# Patient Record
Sex: Female | Born: 1955 | Race: Black or African American | Hispanic: No | Smoking: Current every day smoker
Health system: Southern US, Community
[De-identification: ages and names within clinical notes are randomized; demographics above are authoritative.]

---

## 1978-07-19 HISTORY — PX: HERNIA REPAIR: SHX51

## 1999-02-02 ENCOUNTER — Ambulatory Visit (HOSPITAL_COMMUNITY): Admission: RE | Admit: 1999-02-02 | Discharge: 1999-02-02 | Payer: Self-pay | Admitting: *Deleted

## 2004-05-29 ENCOUNTER — Ambulatory Visit: Payer: Self-pay | Admitting: Family Medicine

## 2004-08-13 ENCOUNTER — Encounter: Admission: RE | Admit: 2004-08-13 | Discharge: 2004-08-13 | Payer: Self-pay | Admitting: Family Medicine

## 2004-08-18 ENCOUNTER — Encounter: Admission: RE | Admit: 2004-08-18 | Discharge: 2004-08-18 | Payer: Self-pay | Admitting: Family Medicine

## 2004-09-07 ENCOUNTER — Ambulatory Visit: Payer: Self-pay | Admitting: Gastroenterology

## 2004-09-17 ENCOUNTER — Ambulatory Visit: Payer: Self-pay | Admitting: Gastroenterology

## 2011-01-21 ENCOUNTER — Emergency Department (HOSPITAL_COMMUNITY)
Admission: EM | Admit: 2011-01-21 | Discharge: 2011-01-22 | Disposition: A | Discharge status: Home / Self Care | Payer: Medicaid Other | Attending: Emergency Medicine | Admitting: Emergency Medicine

## 2011-01-21 DIAGNOSIS — M545 Low back pain, unspecified: Secondary | ICD-10-CM | POA: Insufficient documentation

## 2011-01-21 DIAGNOSIS — X58XXXA Exposure to other specified factors, initial encounter: Secondary | ICD-10-CM | POA: Insufficient documentation

## 2011-01-21 DIAGNOSIS — S335XXA Sprain of ligaments of lumbar spine, initial encounter: Secondary | ICD-10-CM | POA: Insufficient documentation

## 2011-01-28 ENCOUNTER — Other Ambulatory Visit: Payer: Self-pay | Admitting: Family Medicine

## 2011-01-28 DIAGNOSIS — Z1231 Encounter for screening mammogram for malignant neoplasm of breast: Secondary | ICD-10-CM

## 2011-02-05 ENCOUNTER — Ambulatory Visit
Admission: RE | Admit: 2011-02-05 | Discharge: 2011-02-05 | Disposition: A | Discharge status: Home / Self Care | Payer: Medicaid Other | Source: Ambulatory Visit | Attending: Family Medicine | Admitting: Family Medicine

## 2011-02-05 DIAGNOSIS — Z1231 Encounter for screening mammogram for malignant neoplasm of breast: Secondary | ICD-10-CM

## 2011-03-15 ENCOUNTER — Other Ambulatory Visit (HOSPITAL_COMMUNITY): Payer: Self-pay | Admitting: Obstetrics

## 2011-03-15 DIAGNOSIS — N951 Menopausal and female climacteric states: Secondary | ICD-10-CM

## 2011-03-24 ENCOUNTER — Ambulatory Visit (HOSPITAL_COMMUNITY): Payer: Medicaid Other

## 2011-03-31 ENCOUNTER — Ambulatory Visit (HOSPITAL_COMMUNITY): Admission: RE | Admit: 2011-03-31 | Payer: Medicaid Other | Source: Ambulatory Visit

## 2011-04-07 ENCOUNTER — Ambulatory Visit (HOSPITAL_COMMUNITY)
Admission: RE | Admit: 2011-04-07 | Discharge: 2011-04-07 | Disposition: A | Discharge status: Home / Self Care | Payer: Medicaid Other | Source: Ambulatory Visit | Attending: Obstetrics | Admitting: Obstetrics

## 2011-04-07 DIAGNOSIS — N951 Menopausal and female climacteric states: Secondary | ICD-10-CM

## 2011-04-07 DIAGNOSIS — Z780 Asymptomatic menopausal state: Secondary | ICD-10-CM | POA: Insufficient documentation

## 2011-04-07 DIAGNOSIS — Z13820 Encounter for screening for osteoporosis: Secondary | ICD-10-CM | POA: Insufficient documentation

## 2011-08-25 ENCOUNTER — Encounter: Payer: Self-pay | Admitting: Gastroenterology

## 2014-03-21 ENCOUNTER — Ambulatory Visit: Payer: Medicaid Other | Admitting: Family Medicine

## 2014-03-22 ENCOUNTER — Telehealth: Payer: Self-pay | Admitting: Family Medicine

## 2014-03-22 NOTE — Telephone Encounter (Signed)
Attempted to call patient to reschedule however phone line was unavailable;

## 2014-04-25 ENCOUNTER — Other Ambulatory Visit (HOSPITAL_COMMUNITY): Admission: RE | Admit: 2014-04-25 | Payer: Medicaid Other | Source: Ambulatory Visit | Admitting: Family Medicine

## 2014-04-25 ENCOUNTER — Encounter: Payer: Self-pay | Admitting: Internal Medicine

## 2014-04-25 ENCOUNTER — Ambulatory Visit: Payer: No Typology Code available for payment source | Attending: Family Medicine | Admitting: Internal Medicine

## 2014-04-25 ENCOUNTER — Other Ambulatory Visit (HOSPITAL_COMMUNITY)
Admission: RE | Admit: 2014-04-25 | Discharge: 2014-04-25 | Disposition: A | Discharge status: Home / Self Care | Payer: No Typology Code available for payment source | Source: Ambulatory Visit | Attending: Family Medicine | Admitting: Family Medicine

## 2014-04-25 VITALS — BP 153/83 | HR 73 | Temp 98.8°F | Resp 16 | Ht 67.5 in | Wt 131.0 lb

## 2014-04-25 DIAGNOSIS — F17200 Nicotine dependence, unspecified, uncomplicated: Secondary | ICD-10-CM

## 2014-04-25 DIAGNOSIS — M549 Dorsalgia, unspecified: Secondary | ICD-10-CM | POA: Insufficient documentation

## 2014-04-25 DIAGNOSIS — Z01419 Encounter for gynecological examination (general) (routine) without abnormal findings: Secondary | ICD-10-CM | POA: Insufficient documentation

## 2014-04-25 DIAGNOSIS — R030 Elevated blood-pressure reading, without diagnosis of hypertension: Secondary | ICD-10-CM

## 2014-04-25 DIAGNOSIS — N760 Acute vaginitis: Secondary | ICD-10-CM | POA: Insufficient documentation

## 2014-04-25 DIAGNOSIS — A539 Syphilis, unspecified: Secondary | ICD-10-CM

## 2014-04-25 DIAGNOSIS — Z2821 Immunization not carried out because of patient refusal: Secondary | ICD-10-CM

## 2014-04-25 DIAGNOSIS — Z113 Encounter for screening for infections with a predominantly sexual mode of transmission: Secondary | ICD-10-CM | POA: Insufficient documentation

## 2014-04-25 DIAGNOSIS — Z0000 Encounter for general adult medical examination without abnormal findings: Secondary | ICD-10-CM

## 2014-04-25 DIAGNOSIS — Z780 Asymptomatic menopausal state: Secondary | ICD-10-CM | POA: Insufficient documentation

## 2014-04-25 DIAGNOSIS — F17210 Nicotine dependence, cigarettes, uncomplicated: Secondary | ICD-10-CM | POA: Insufficient documentation

## 2014-04-25 DIAGNOSIS — I10 Essential (primary) hypertension: Secondary | ICD-10-CM | POA: Insufficient documentation

## 2014-04-25 DIAGNOSIS — IMO0001 Reserved for inherently not codable concepts without codable children: Secondary | ICD-10-CM | POA: Insufficient documentation

## 2014-04-25 DIAGNOSIS — Z720 Tobacco use: Secondary | ICD-10-CM

## 2014-04-25 LAB — COMPLETE METABOLIC PANEL WITH GFR
ALK PHOS: 40 U/L (ref 39–117)
ALT: 14 U/L (ref 0–35)
AST: 18 U/L (ref 0–37)
Albumin: 4.6 g/dL (ref 3.5–5.2)
BUN: 7 mg/dL (ref 6–23)
CO2: 21 mEq/L (ref 19–32)
Calcium: 9.7 mg/dL (ref 8.4–10.5)
Chloride: 103 mEq/L (ref 96–112)
Creat: 0.72 mg/dL (ref 0.50–1.10)
GFR, Est African American: >89 mL/min
GFR, Est Non African American: >89 mL/min
Glucose, Bld: 94 mg/dL (ref 70–99)
POTASSIUM: 4.1 meq/L (ref 3.5–5.3)
SODIUM: 137 meq/L (ref 135–145)
TOTAL PROTEIN: 7.9 g/dL (ref 6.0–8.3)
Total Bilirubin: 0.4 mg/dL (ref 0.2–1.2)

## 2014-04-25 LAB — POCT URINALYSIS DIPSTICK
BILIRUBIN UA: NEGATIVE
Glucose, UA: NEGATIVE
KETONES UA: NEGATIVE
Leukocytes, UA: NEGATIVE
Nitrite, UA: NEGATIVE
PH UA: 5
RBC UA: NEGATIVE
Spec Grav, UA: 1.025
Urobilinogen, UA: 0.2

## 2014-04-25 LAB — RPR TITER: RPR Titer: 1:2 {titer}

## 2014-04-25 LAB — CBC
HEMATOCRIT: 47.2 % — AB (ref 36.0–46.0)
Hemoglobin: 15.7 g/dL — ABNORMAL HIGH (ref 12.0–15.0)
MCH: 31 pg (ref 26.0–34.0)
MCHC: 33.3 g/dL (ref 30.0–36.0)
MCV: 93.3 fL (ref 78.0–100.0)
Platelets: 289 10*3/uL (ref 150–400)
RBC: 5.06 MIL/uL (ref 3.87–5.11)
RDW: 12.9 % (ref 11.5–15.5)
WBC: 5.9 10*3/uL (ref 4.0–10.5)

## 2014-04-25 LAB — RPR: RPR Ser Ql: REACTIVE — AB

## 2014-04-25 LAB — LIPID PANEL
Cholesterol: 230 mg/dL — ABNORMAL HIGH (ref 0–200)
HDL: 85 mg/dL (ref >39)
LDL Cholesterol: 131 mg/dL — ABNORMAL HIGH (ref 0–99)
Total CHOL/HDL Ratio: 2.7 Ratio
Triglycerides: 72 mg/dL (ref <150)
VLDL: 14 mg/dL (ref 0–40)
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
## 2014-04-26 LAB — CYTOLOGY - PAP

## 2014-04-26 LAB — HIV ANTIBODY (ROUTINE TESTING W REFLEX): HIV: NONREACTIVE

## 2014-04-26 LAB — VITAMIN D 25 HYDROXY (VIT D DEFICIENCY, FRACTURES): VIT D 25 HYDROXY: 16 ng/mL — AB (ref 30–89)

## 2014-04-26 LAB — CERVICOVAGINAL ANCILLARY ONLY
Chlamydia: NEGATIVE
NEISSERIA GONORRHEA: NEGATIVE

## 2014-04-26 LAB — FLUORESCENT TREPONEMAL AB(FTA)-IGG-BLD: Fluorescent Treponemal ABS: REACTIVE — AB

## 2014-04-29 ENCOUNTER — Telehealth: Payer: Self-pay

## 2014-04-29 LAB — CERVICOVAGINAL ANCILLARY ONLY
Wet Prep (BD Affirm): NEGATIVE
Wet Prep (BD Affirm): NEGATIVE
Wet Prep (BD Affirm): NEGATIVE

## 2014-04-29 MED ORDER — VITAMIN D (ERGOCALCIFEROL) 1.25 MG (50000 UNIT) PO CAPS: 50000.0000 [IU] | ORAL_CAPSULE | ORAL | Status: AC

## 2014-04-29 MED ORDER — ATORVASTATIN CALCIUM 10 MG PO TABS: 10.0000 mg | ORAL_TABLET | Freq: Every day | ORAL | Status: AC

## 2014-04-29 NOTE — Telephone Encounter (Signed)
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  Let her know that this is a STD and all partners will need to be tested/treated.  This is a very serious disease and treatment is needed soon. Please send referral to Infectious Disease for treatment.  Cholesterol is elevated please send atorvastatin 10 mg and vitamin d 50,000 IU once weekly for 12 weeks with no refills. Please educate on dietary changes and exercise.  Thanks ------

## 2014-04-29 NOTE — Addendum Note (Signed)
Addended by: Lestine MountJUAREZ, Draven Laine L on: 04/29/2014 11:57 AM   Modules accepted: Orders

## 2014-04-29 NOTE — Telephone Encounter (Signed)
Patient not available Left message on voice mail to return our call 

## 2014-04-30 ENCOUNTER — Telehealth: Payer: Self-pay | Admitting: Emergency Medicine

## 2014-04-30 NOTE — Telephone Encounter (Signed)
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  Let her know that this is a STD and all partners will need to be tested/treated.  This is a very serious disease and treatment is needed soon. Please send referral to Infectious Disease for treatment.  Cholesterol is elevated please send atorvastatin 10 mg and vitamin d 50,000 IU once weekly for 12 weeks with no refills. Please educate on dietary changes and exercise.  Thanks ------

## 2014-04-30 NOTE — Telephone Encounter (Signed)
Pt given lab results with instructions on referral to ID Pt also informed of cholesterol/Vitamin D replacements needs. Medication will be sent to [facility] pharmacy Pt will be in tomorrow to pick up

## 2014-05-02 NOTE — Telephone Encounter (Signed)
Message copied by Darlis LoanSMITH, JILL D on Thu May 02, 2014  5:05 PM ------      Message from: Holland CommonsKECK, VALERIE A      Created: Tue Apr 30, 2014  8:18 PM       Pap is negative for malignancies ------

## 2014-05-02 NOTE — Telephone Encounter (Signed)
Pt given negative pap smear results 

## 2014-05-09 ENCOUNTER — Ambulatory Visit: Payer: No Typology Code available for payment source | Attending: Family Medicine

## 2014-05-09 NOTE — Patient Instructions (Signed)
Pt comes in for blood pressure recheck due to elevated BP during last visit BP 145/83 72 Pt requesting referral number for Infectious Disease- pt need to schedule appt with health department for treatmentDASH Eating Plan DASH stands for "Dietary Approaches to Stop Hypertension." The DASH eating plan is a healthy eating plan that has been shown to reduce high blood pressure (hypertension). Additional health benefits may include reducing the risk of type 2 diabetes mellitus, heart disease, and stroke. The DASH eating plan may also help with weight loss. WHAT DO I NEED TO KNOW ABOUT THE DASH EATING PLAN? For the DASH eating plan, you will follow these general guidelines:  Choose foods with a percent daily value for sodium of less than 5% (as listed on the food label).  Use salt-free seasonings or herbs instead of table salt or sea salt.  Check with your health care provider or pharmacist before using salt substitutes.  Eat lower-sodium products, often labeled as "lower sodium" or "no salt added."  Eat fresh foods.  Eat more vegetables, fruits, and low-fat dairy products.  Choose whole grains. Look for the word "whole" as the first word in the ingredient list.  Choose fish and skinless chicken or Malaw[place]urkey more often than red meat. Limit fish, poultry, and meat to 6 oz (170 g) each day.  Limit sweets, desserts, sugars, and sugary drinks.  Choose heart-healthy fats.  Limit cheese to 1 oz (28 g) per day.  Eat more home-cooked food and less restaurant, buffet, and fast food.  Limit fried foods.  Cook foods using methods other than frying.  Limit canned vegetables. If you do use them, rinse them well to decrease the sodium.  When eating at a restaurant, ask that your food be prepared with less salt, or no salt if possible. WHAT FOODS CAN I EAT? Seek help from a dietitian for individual calorie needs. Grains Whole grain or whole wheat bread. Brown rice. Whole grain or whole wheat pasta.  Quinoa, bulgur, and whole grain cereals. Low-sodium cereals. Corn or whole wheat flour tortillas. Whole grain cornbread. Whole grain crackers. Low-sodium crackers. Vegetables Fresh or frozen vegetables (raw, steamed, roasted, or grilled). Low-sodium or reduced-sodium tomato and vegetable juices. Low-sodium or reduced-sodium tomato sauce and paste. Low-sodium or reduced-sodium canned vegetables.  Fruits All fresh, canned (in natural juice), or frozen fruits. Meat and Other Protein Products Ground beef (85% or leaner), grass-fed beef, or beef trimmed of fat. Skinless chicken or Malaw[place]key. Ground chicken or Mal[place]urkey. Pork trimmed of fat. All fish and seafood. Eggs. Dried beans, peas, or lentils. Unsalted nuts and seeds. Unsalted canned beans. Dairy Low-fat dairy products, such as skim or 1% milk, 2% or reduced-fat cheeses, low-fat ricotta or cottage cheese, or plain low-fat yogurt. Low-sodium or reduced-sodium cheeses. Fats and Oils Tub margarines without trans fats. Light or reduced-fat mayonnaise and salad dressings (reduced sodium). Avocado. Safflower, olive, or canola oils. Natural peanut or almond butter. Other Unsalted popcorn and pretzels. The items listed above may not be a complete list of recommended foods or beverages. Contact your dietitian for more options. WHAT FOODS ARE NOT RECOMMENDED? Grains White bread. White pasta. White rice. Refined cornbread. Bagels and croissants. Crackers that contain trans fat. Vegetables Creamed or fried vegetables. Vegetables in a cheese sauce. Regular canned vegetables. Regular canned tomato sauce and paste. Regular tomato and vegetable juices. Fruits Dried fruits. Canned fruit in light or heavy syrup. Fruit juice. Meat and Other Protein Products Fatty cuts of meat. Ribs, chicken wings, bacon, sausage, bologna,  salami, chitterlings, fatback, hot dogs, bratwurst, and packaged luncheon meats. Salted nuts and seeds. Canned beans with salt. Dairy Whole or 2%  milk, cream, half-and-half, and cream cheese. Whole-fat or sweetened yogurt. Full-fat cheeses or blue cheese. Nondairy creamers and whipped toppings. Processed cheese, cheese spreads, or cheese curds. Condiments Onion and garlic salt, seasoned salt, table salt, and sea salt. Canned and packaged gravies. Worcestershire sauce. Tartar sauce. Barbecue sauce. Teriyaki sauce. Soy sauce, including reduced sodium. Steak sauce. Fish sauce. Oyster sauce. Cocktail sauce. Horseradish. Ketchup and mustard. Meat flavorings and tenderizers. Bouillon cubes. Hot sauce. Tabasco sauce. Marinades. Taco seasonings. Relishes. Fats and Oils Butter, stick margarine, lard, shortening, ghee, and bacon fat. Coconut, palm kernel, or palm oils. Regular salad dressings. Other Pickles and olives. Salted popcorn and pretzels. The items listed above may not be a complete list of foods and beverages to avoid. Contact your dietitian for more information. WHERE CAN I FIND MORE INFORMATION? National Heart, Lung, and Blood Institute: travelstabloid.com Document Released: 06/24/2011 Document Revised: 11/19/2013 Document Reviewed: 05/09/2013 [facility] Patient Information 2015 [place], [place]. This information is not intended to replace advice given to you by your health care provider. Make sure you discuss any questions you have with your health care provider.

## 2014-05-09 NOTE — Progress Notes (Unsigned)
Patient ID: Jessica GumsVivian Mccormick, female   DOB: [birth year], 58 y.o.   MRN: [id number] Pt comes in today for blood pressure recheck Not taking medication for HTN Denies headache,n,v or chest pain BP-145/83 72

## 2017-08-28 ENCOUNTER — Other Ambulatory Visit: Payer: Self-pay

## 2017-08-28 ENCOUNTER — Encounter (HOSPITAL_COMMUNITY): Payer: Self-pay

## 2017-08-28 ENCOUNTER — Emergency Department (HOSPITAL_COMMUNITY): Payer: Self-pay

## 2017-08-28 ENCOUNTER — Emergency Department (HOSPITAL_COMMUNITY)
Admission: EM | Admit: 2017-08-28 | Discharge: 2017-08-28 | Disposition: A | Discharge status: Home / Self Care | Payer: Self-pay | Attending: Emergency Medicine | Admitting: Emergency Medicine

## 2017-08-28 DIAGNOSIS — M545 Low back pain, unspecified: Secondary | ICD-10-CM

## 2017-08-28 DIAGNOSIS — F17210 Nicotine dependence, cigarettes, uncomplicated: Secondary | ICD-10-CM | POA: Insufficient documentation

## 2017-08-28 DIAGNOSIS — R030 Elevated blood-pressure reading, without diagnosis of hypertension: Secondary | ICD-10-CM | POA: Insufficient documentation

## 2017-08-28 DIAGNOSIS — Z79899 Other long term (current) drug therapy: Secondary | ICD-10-CM | POA: Insufficient documentation

## 2017-08-28 LAB — URINALYSIS, ROUTINE W REFLEX MICROSCOPIC
BILIRUBIN URINE: NEGATIVE
GLUCOSE, UA: NEGATIVE mg/dL
HGB URINE DIPSTICK: NEGATIVE
Ketones, ur: NEGATIVE mg/dL
Leukocytes, UA: NEGATIVE
Nitrite: NEGATIVE
Protein, ur: NEGATIVE mg/dL
SPECIFIC GRAVITY, URINE: 1.015 (ref 1.005–1.030)
pH: 5 (ref 5.0–8.0)

## 2017-08-28 MED ORDER — METHOCARBAMOL 500 MG PO TABS
500.0000 mg | ORAL_TABLET | Freq: Once | ORAL | Status: AC
Start: 2017-08-28 — End: 2017-08-28
  Administered 2017-08-28: 500 mg via ORAL
  Filled 2017-08-28: qty 1

## 2017-08-28 MED ORDER — METHOCARBAMOL 500 MG PO TABS: 500.0000 mg | ORAL_TABLET | Freq: Two times a day (BID) | ORAL | 0 refills | Status: AC | PRN

## 2017-08-28 MED ORDER — OXYCODONE-ACETAMINOPHEN 5-325 MG PO TABS
1.0000 | ORAL_TABLET | Freq: Once | ORAL | Status: AC
  Administered 2017-08-28: 1 via ORAL
  Filled 2017-08-28: qty 1

## 2017-08-28 MED ORDER — NAPROXEN 500 MG PO TABS: 500.0000 mg | ORAL_TABLET | Freq: Two times a day (BID) | ORAL | 0 refills | Status: AC | PRN

## 2017-08-28 NOTE — ED Notes (Signed)
Called for patient in waiting area with no answer

## 2017-08-28 NOTE — ED Triage Notes (Addendum)
Per Pt, Pt reports that she is having left lower back pain in the middle the started this morning. Denies any injury. Denies any urinary symptoms, or numbness and tingling.

## 2017-08-28 NOTE — ED Notes (Signed)
Patient transported to X-ray 

## 2017-08-28 NOTE — Discharge Instructions (Signed)
It was my pleasure taking care of you today!   You have been seen in the Emergency Department today for back pain.   Robaxin is your muscle relaxer to take as needed. Naproxen as needed for pain. In addition to this, use ice and/or heat for additional pain relief.  Your back pain should get better over the next 2 weeks. Please follow up with your doctor this week for a recheck if still having symptoms.  Your blood pressure was elevated today. You need to call a primary care doctor to schedule a follow up appointment for blood pressure recheck as soon as possible.   COLD THERAPY DIRECTIONS:  Ice or gel packs can be used to reduce both pain and swelling. Ice is the most helpful within the first 24 to 48 hours after an injury or flareup from overusing a muscle or joint.  Ice is effective, has very few side effects, and is safe for most people to use.    Return to the ED for worsening back pain, fever, weakness or numbness of either leg, or if you develop either (1) an inability to urinate or have bowel movements, or (2) loss of your ability to control your bathroom functions (if you start having "accidents"), or if you develop other new symptoms that concern you.   To find a primary care or specialty doctor please call [phone] or [phone] to access "[facility] Find a Doctor Service."  You may also go on the [facility] website at [email or web address]  There are also multiple [facility] practices throughout the [place] that are frequently accepting new patients. You may find a clinic that is close to your home and contact them.  [facility] - [address] [place] [id number] [phone]  [facility] in [place] (also locations in [place] and [place]) - [address] [place] [phone]  [facility] - [address] [place] [phone]
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
## 2019-08-06 IMAGING — DX DG LUMBAR SPINE COMPLETE 4+V
5 series · 5 of 5 positions shown · non-contrast
Comparison: None.

CLINICAL DATA: Lumbago

EXAM:
LUMBAR SPINE - COMPLETE 4+ VIEW

[t lumbar spine ap]
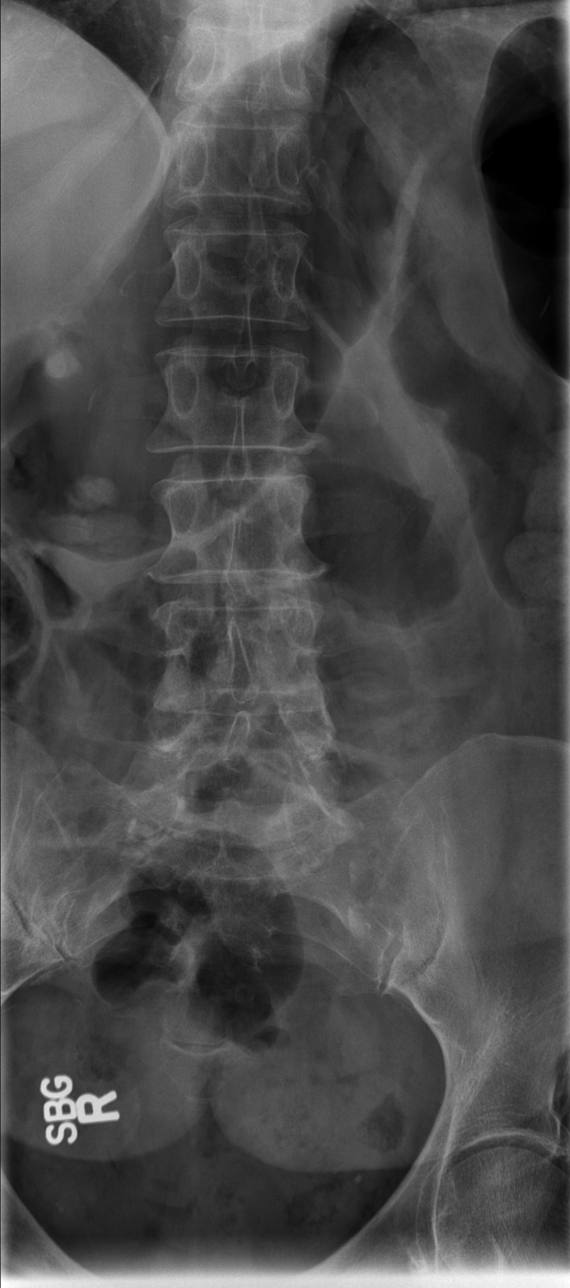

[t lumbar spine obl (1 of 2)]
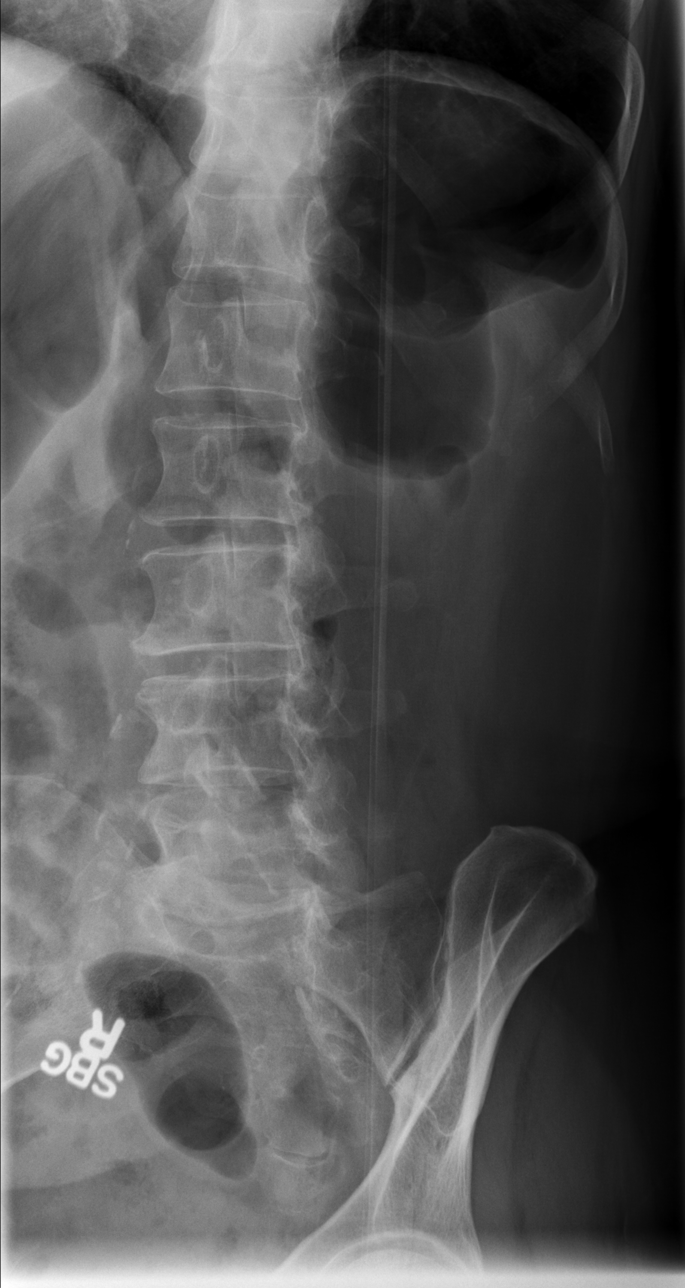

[t lumbar spine obl (2 of 2)]
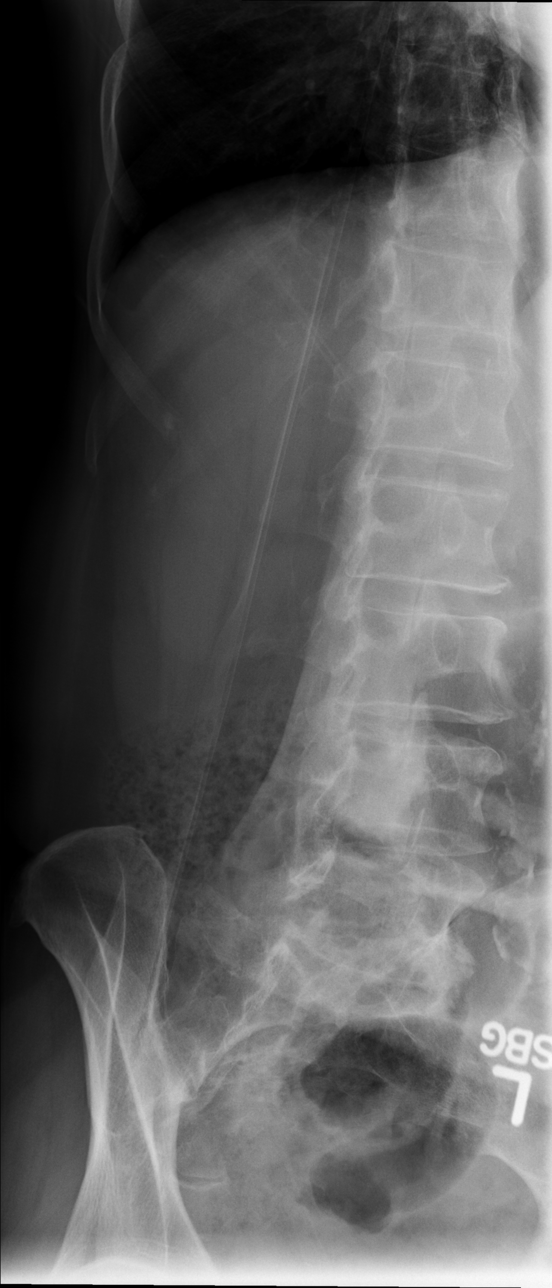

[t lumbar spine lat]
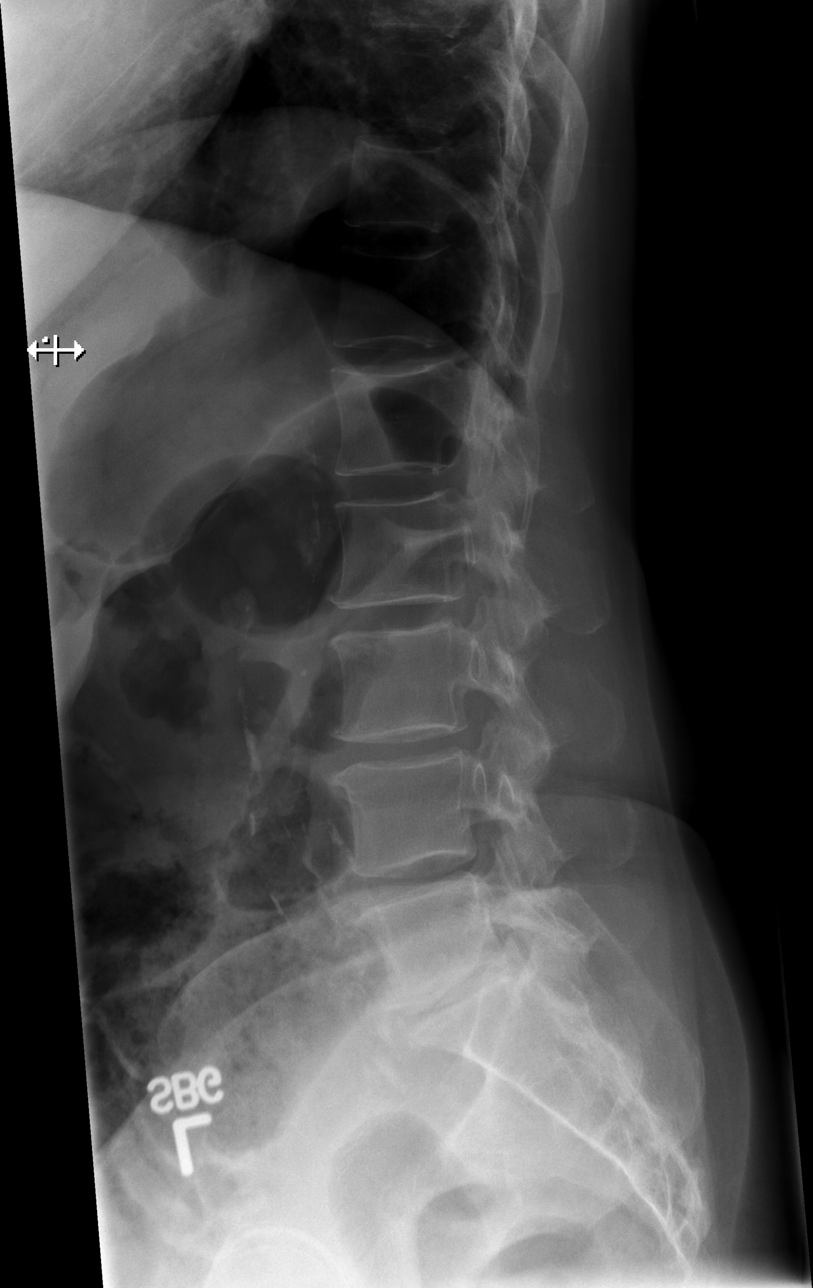

[t lumbar l-5 s-1 spot]
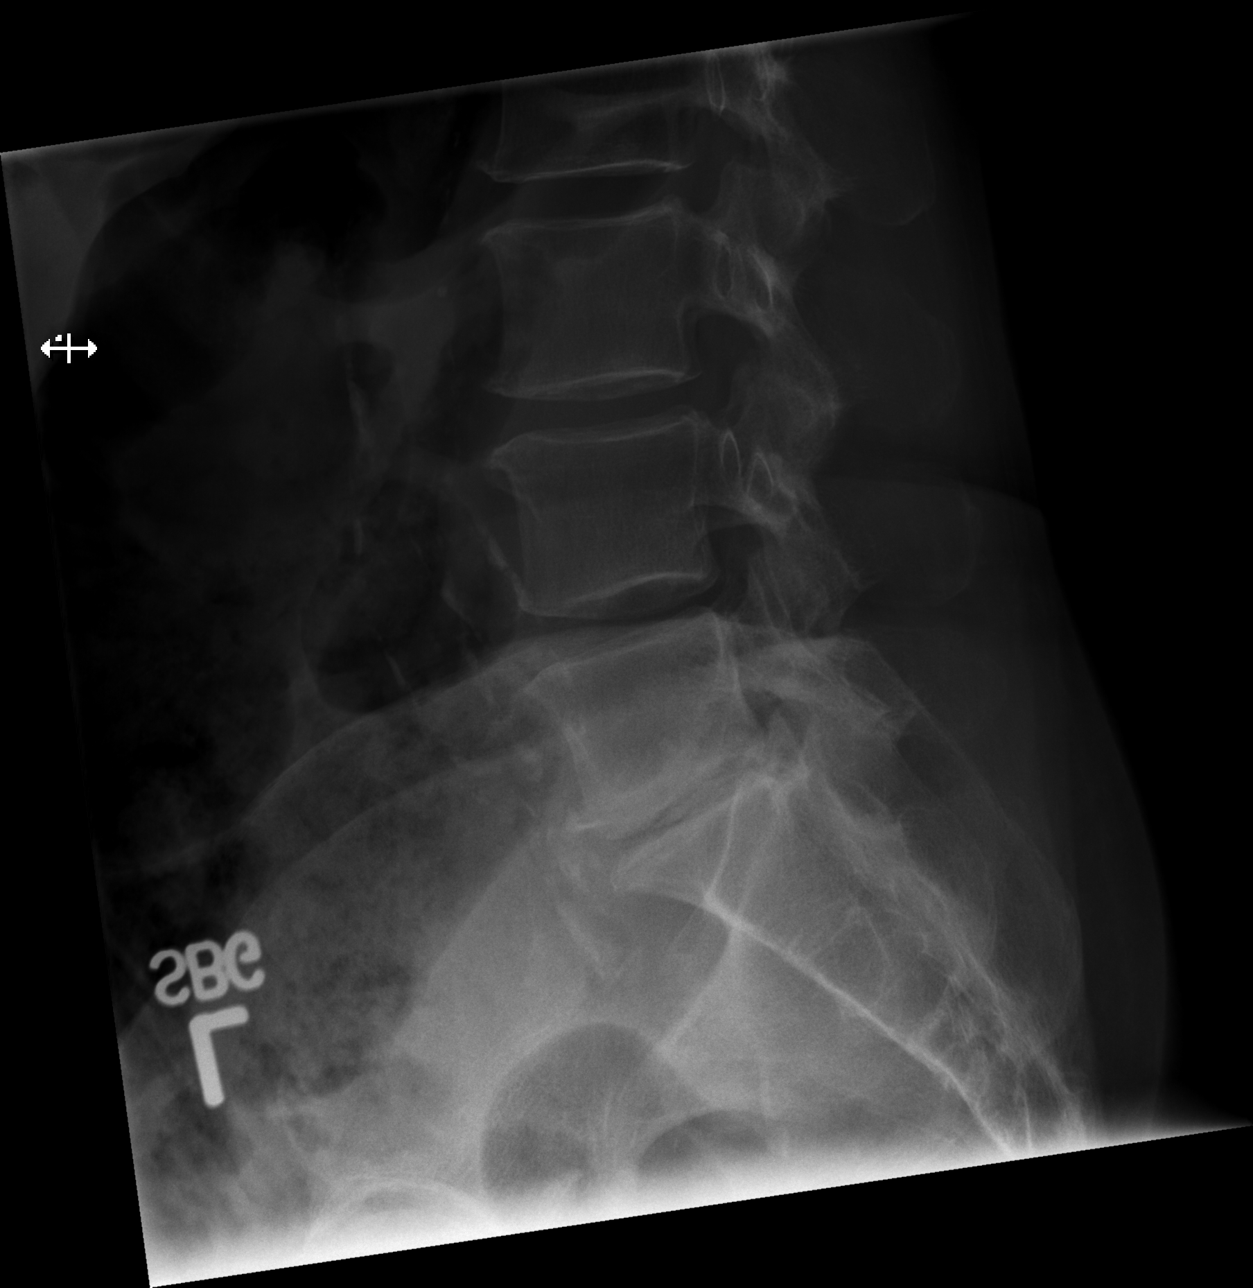

[5 of 5 positions shown; findings below may reference images not displayed]

FINDINGS: Frontal, lateral, spot lumbosacral lateral, and bilateral oblique
views were obtained. There are 5 non-rib-bearing lumbar type
vertebral bodies. There is no fracture or spondylolisthesis. There
is moderately severe disc space narrowing at L5-S1. There is milder
disc space narrowing at L4-5. There are anterior osteophytes at L3,
L4, and L5. there is facet osteoarthritic change at L4-5
bilaterally. There is aortoiliac atherosclerosis.
IMPRESSION: Osteoarthritic change in the lower lumbar region. No fracture or
spondylolisthesis. There is aortoiliac atherosclerosis.

Aortic Atherosclerosis ([id number]).

## 2019-11-17 DEATH — deceased
